# Patient Record
Sex: Female | Born: 1990 | Hispanic: Yes | Marital: Single | Smoking: Former smoker
Health system: Southern US, Community
[De-identification: ages and names within clinical notes are randomized; demographics above are authoritative.]

## PROBLEM LIST (undated history)

## (undated) DIAGNOSIS — D219 Benign neoplasm of connective and other soft tissue, unspecified: Secondary | ICD-10-CM

## (undated) HISTORY — PX: CHOLECYSTECTOMY: SHX55

## (undated) HISTORY — DX: Benign neoplasm of connective and other soft tissue, unspecified: D21.9

---
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
## 2009-01-27 ENCOUNTER — Emergency Department: Payer: Self-pay | Admitting: Emergency Medicine

## 2009-10-21 ENCOUNTER — Emergency Department: Payer: Self-pay | Admitting: Emergency Medicine

## 2009-12-22 ENCOUNTER — Emergency Department: Payer: Self-pay | Admitting: Emergency Medicine

## 2009-12-27 ENCOUNTER — Emergency Department: Payer: Self-pay | Admitting: Emergency Medicine

## 2010-05-01 ENCOUNTER — Observation Stay: Payer: Self-pay | Admitting: Obstetrics and Gynecology

## 2010-05-22 ENCOUNTER — Emergency Department: Payer: Self-pay | Admitting: Unknown Physician Specialty

## 2010-11-17 ENCOUNTER — Inpatient Hospital Stay: Payer: Self-pay | Admitting: Surgery

## 2010-11-21 LAB — PATHOLOGY REPORT

## 2014-03-26 ENCOUNTER — Emergency Department: Payer: Self-pay | Admitting: Emergency Medicine

## 2014-03-26 LAB — CBC
HCT: 35.4 % (ref 35.0–47.0)
HGB: 11.4 g/dL — ABNORMAL LOW (ref 12.0–16.0)
MCH: 29.3 pg (ref 26.0–34.0)
MCHC: 32.2 g/dL (ref 32.0–36.0)
MCV: 91 fL (ref 80–100)
Platelet: 202 10*3/uL (ref 150–440)
RBC: 3.89 10*6/uL (ref 3.80–5.20)
RDW: 13 % (ref 11.5–14.5)
WBC: 10.4 10*3/uL (ref 3.6–11.0)

## 2014-03-26 LAB — D-DIMER(ARMC): D-Dimer: 343 ng/ml

## 2015-04-11 IMAGING — CR DG CHEST 2V
1 series · 2 of 2 positions shown · non-contrast
Comparison: None.

CLINICAL DATA: Pleurisy.  Three days trouble breathing.

EXAM:
CHEST  2 VIEW

[Series 1: w chest pa · 0.14mm/px · 2 of 2 slices shown]
[im 1/2]
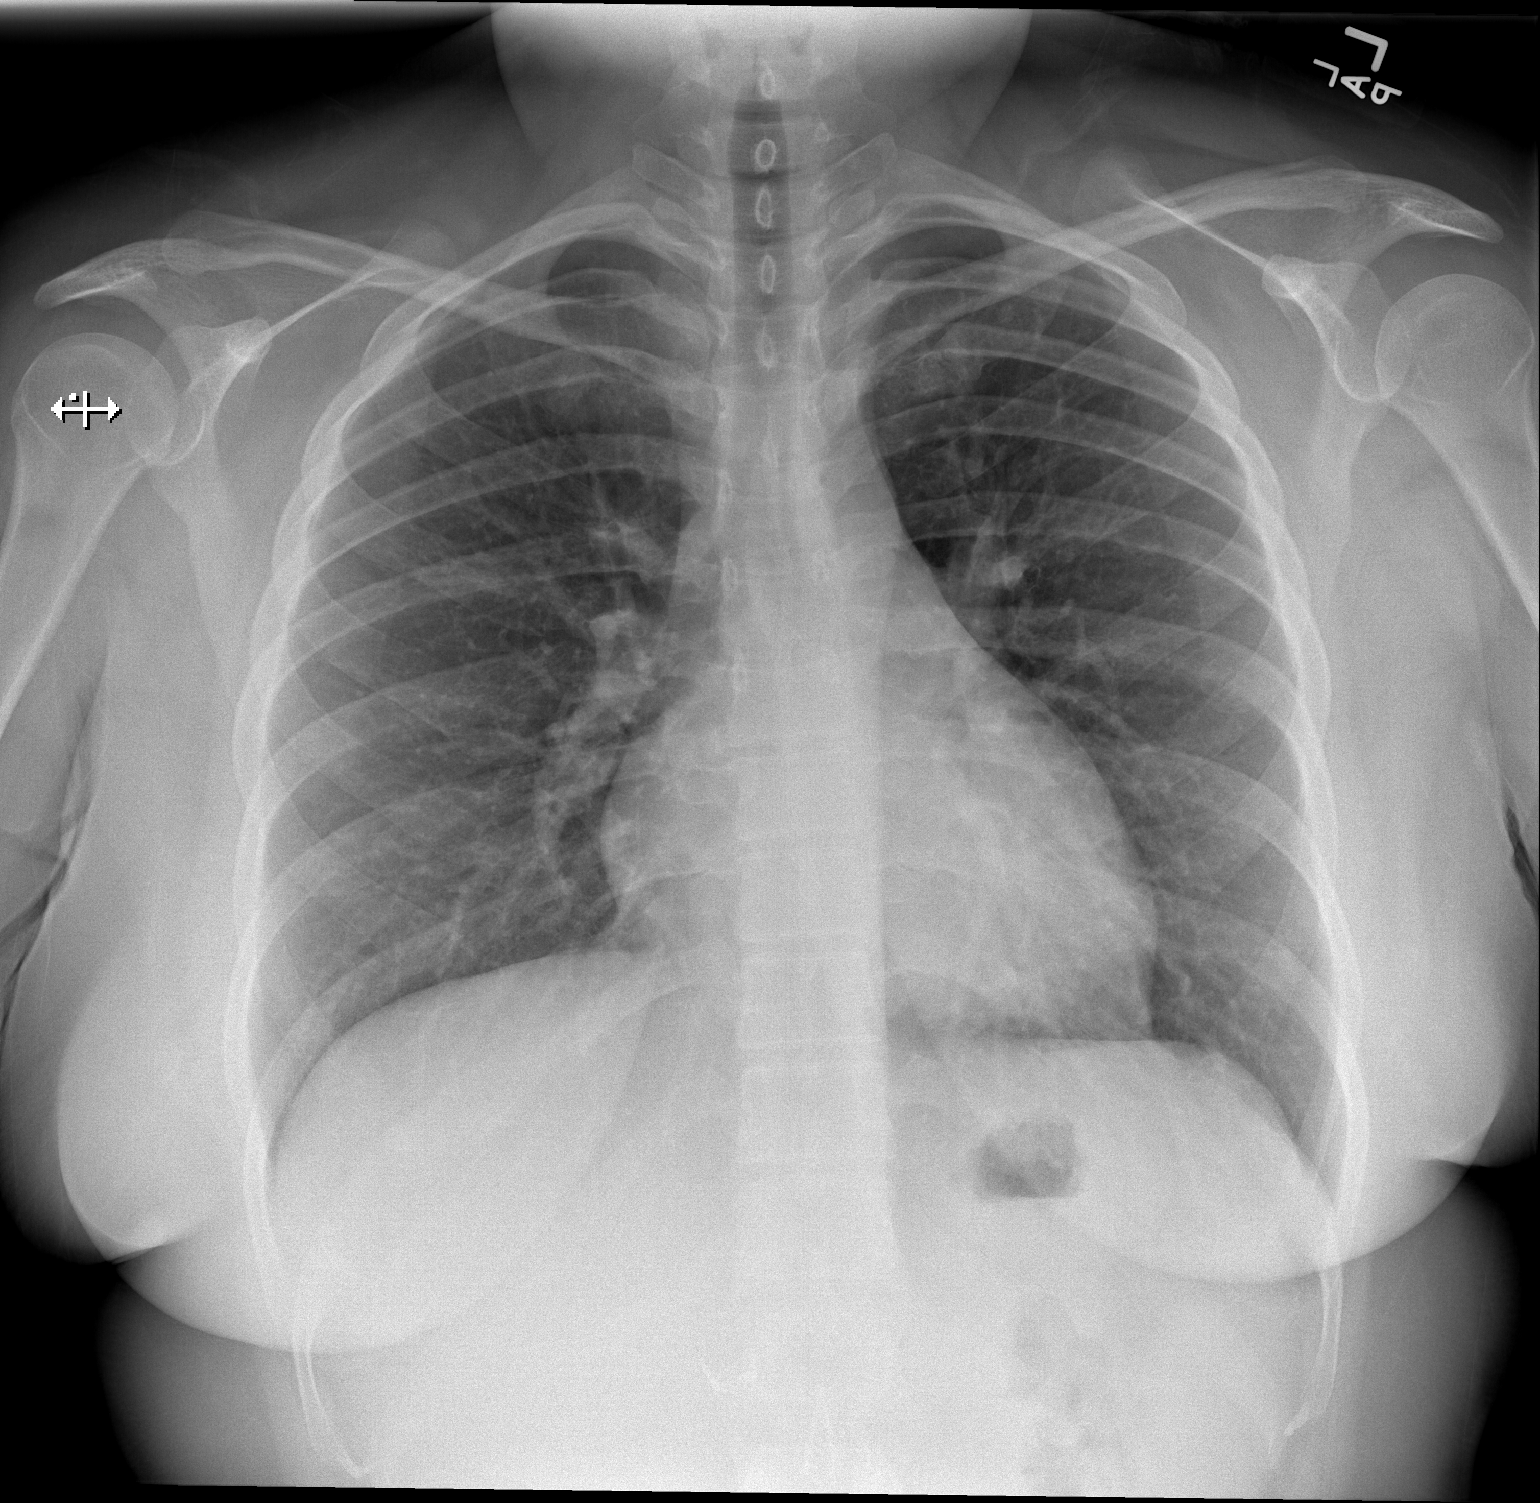
[im 2/2]
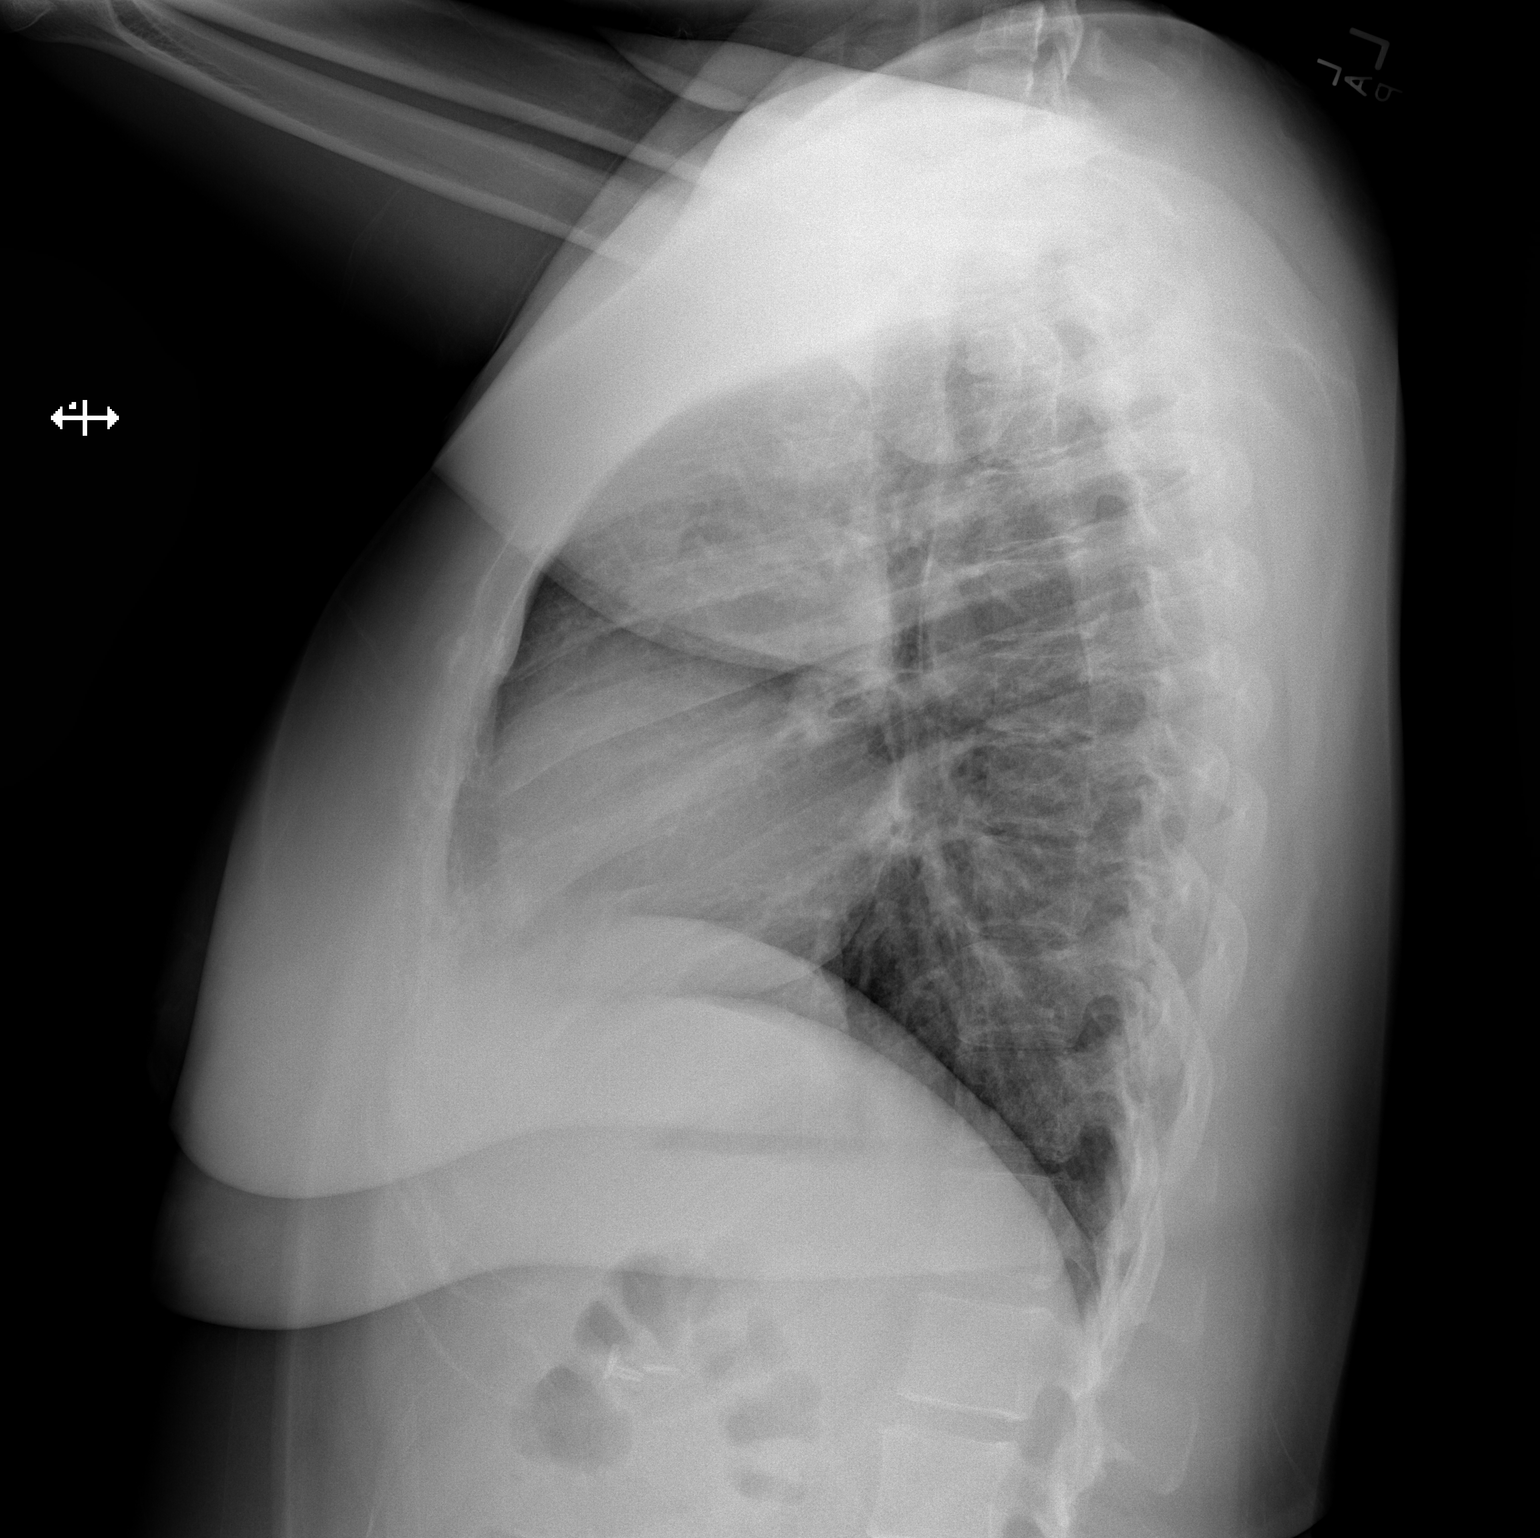

[2 of 2 positions shown; findings below may reference images not displayed]

FINDINGS: The heart size and mediastinal contours are within normal limits.
Both lungs are well expanded. No airspace disease, effusion, or
pneumothorax. The trachea is midline. The visualized skeletal
structures are unremarkable.
IMPRESSION: No active cardiopulmonary disease.

## 2018-03-21 ENCOUNTER — Encounter: Payer: Self-pay | Admitting: Podiatry

## 2018-03-21 ENCOUNTER — Ambulatory Visit (INDEPENDENT_AMBULATORY_CARE_PROVIDER_SITE_OTHER): Payer: 59

## 2018-03-21 ENCOUNTER — Ambulatory Visit: Payer: 59 | Admitting: Podiatry

## 2018-03-21 DIAGNOSIS — M722 Plantar fascial fibromatosis: Secondary | ICD-10-CM

## 2018-03-21 MED ORDER — METHYLPREDNISOLONE 4 MG PO TBPK: ORAL_TABLET | ORAL | 0 refills | Status: DC

## 2018-03-21 MED ORDER — MELOXICAM 15 MG PO TABS: 15.0000 mg | ORAL_TABLET | Freq: Every day | ORAL | 3 refills | Status: DC

## 2018-03-21 NOTE — Progress Notes (Signed)
  Subjective:  Patient ID: Erica Marshall, female    DOB: [birth year],  MRN: [id number] HPI Chief Complaint  Patient presents with  . Foot Pain    patient presents today for left heel pain x 1 month   She reports having sharp stabbing pains when first standing up from sitting and the pain is much worse first thing in the mornings.  She has taken Tylenol with some relief  . New Patient (Initial Visit)    27 y.o. female presents with the above complaint.   ROS: Denies fever chills nausea vomiting muscle aches and pains.  No past medical history on file.   Current Outpatient Medications:  .  JUNEL FE 1/20 1-20 MG-MCG tablet, TK 1 T PO QD, Disp: , Rfl: 0 .  meloxicam (MOBIC) 15 MG tablet, Take 1 tablet (15 mg total) by mouth daily., Disp: 30 tablet, Rfl: 3 .  methylPREDNISolone (MEDROL DOSEPAK) 4 MG TBPK tablet, 6 day dose pack - take as directed, Disp: 21 tablet, Rfl: 0  No Known Allergies Review of Systems Objective:  There were no vitals filed for this visit.  General: Well developed, nourished, in no acute distress, alert and oriented x3   Dermatological: Skin is warm, dry and supple bilateral. Nails x 10 are well maintained; remaining integument appears unremarkable at this time. There are no open sores, no preulcerative lesions, no rash or signs of infection present.  Vascular: Dorsalis Pedis artery and Posterior Tibial artery pedal pulses are 2/4 bilateral with immedate capillary fill time. Pedal hair growth present. No varicosities and no lower extremity edema present bilateral.   Neruologic: Grossly intact via light touch bilateral. Vibratory intact via tuning fork bilateral. Protective threshold with Semmes Wienstein monofilament intact to all pedal sites bilateral. Patellar and Achilles deep tendon reflexes 2+ bilateral. No Babinski or clonus noted bilateral.   Musculoskeletal: No gross boney pedal deformities bilateral. No pain, crepitus, or limitation noted  with foot and ankle range of motion bilateral. Muscular strength 5/5 in all groups tested bilateral.  Gait: Unassisted, Nonantalgic.    Radiographs:  Radiographs of the left foot taken today demonstrate an osseously mature individual small amount of plantar spurring of the left heel soft tissue increase in density plantar fashion calcaneal insertion site is intact.  No fractures are identified.  Assessment & Plan:   Assessment: Plantar fasciitis left.  Plan: Discussed etiology pathology conservative versus surgical therapies.  After sterile Betadine skin prep I injected 20 mg Kenalog 5 mg Marcaine point maximal tenderness of the left heel.  Tolerated procedure well without complications.  Start her on a Medrol Dosepak to be followed by meloxicam.  Discussed appropriate shoe gear stretching exercise ice therapy sugar modifications.     Erica Maselli T. [place], [place]

## 2018-03-21 NOTE — Patient Instructions (Signed)

## 2018-04-27 ENCOUNTER — Ambulatory Visit: Payer: 59 | Admitting: Podiatry

## 2018-04-27 ENCOUNTER — Encounter: Payer: Self-pay | Admitting: Podiatry

## 2018-04-27 DIAGNOSIS — M722 Plantar fascial fibromatosis: Secondary | ICD-10-CM

## 2018-04-27 MED ORDER — MELOXICAM 15 MG PO TABS: 15.0000 mg | ORAL_TABLET | Freq: Every day | ORAL | 3 refills | Status: DC

## 2018-04-27 NOTE — Progress Notes (Signed)
Presents today for follow-up of the left heel.  The heel is better but is still sore.  Objective: Vital signs are stable alert and oriented x3.  Pulses are palpable.  Neurologic sensorium is intact.  Degenerative flexors are intact.  Erica Marshall has pain on palpation medial calcaneal tubercle of the left heel.  Assessment: Plantar fasciitis resolving.  Plan: I injected the left heel today 20 mg Kenalog 5 mg Marcaine point maximal tenderness left.  Tolerated procedure well without complications.  Continue all other conservative therapies.

## 2018-06-08 ENCOUNTER — Ambulatory Visit: Payer: 59 | Admitting: Podiatry

## 2019-05-17 ENCOUNTER — Telehealth: Payer: Self-pay | Admitting: Physician Assistant

## 2019-05-17 NOTE — Telephone Encounter (Signed)
Received refill request from [facility] for patient.  Per [facility] chart patient had last RP 10/2017 and given Rx with refills for 1 year.  Form completed and OKed for patient to have refills x 3 of Junel Fe 1/20 28d 1 po QD at the same time daily.  Note on form that patient needs to RTC for an office visit prior to any more refills.  Faxed form and fax confirmation received from [facility].

## 2019-11-01 ENCOUNTER — Encounter: Payer: 59 | Admitting: Obstetrics & Gynecology

## 2019-11-29 ENCOUNTER — Encounter: Payer: 59 | Admitting: Obstetrics & Gynecology

## 2020-01-15 ENCOUNTER — Inpatient Hospital Stay: Admission: RE | Admit: 2020-01-15 | Payer: Self-pay | Source: Ambulatory Visit

## 2020-03-26 ENCOUNTER — Other Ambulatory Visit: Payer: 59 | Attending: Obstetrics and Gynecology

## 2020-05-03 ENCOUNTER — Inpatient Hospital Stay: Admission: RE | Admit: 2020-05-03 | Payer: 59 | Source: Ambulatory Visit

## 2020-05-29 ENCOUNTER — Other Ambulatory Visit: Payer: Self-pay | Admitting: Obstetrics and Gynecology

## 2020-05-29 DIAGNOSIS — O26843 Uterine size-date discrepancy, third trimester: Secondary | ICD-10-CM

## 2020-05-30 ENCOUNTER — Ambulatory Visit
Admission: RE | Admit: 2020-05-30 | Discharge: 2020-05-30 | Disposition: A | Discharge status: Home / Self Care | Payer: 59 | Source: Ambulatory Visit | Attending: Obstetrics and Gynecology | Admitting: Obstetrics and Gynecology

## 2020-05-30 ENCOUNTER — Other Ambulatory Visit: Payer: Self-pay

## 2020-05-30 DIAGNOSIS — O26843 Uterine size-date discrepancy, third trimester: Secondary | ICD-10-CM | POA: Diagnosis present

## 2020-07-10 LAB — HM PAP SMEAR

## 2021-06-15 IMAGING — US US OB COMP +14 WK
1 series · 15 of 28 positions shown · non-contrast
Comparison: none

CLINICAL DATA: Size greater than dates.

EXAM:
OBSTETRICAL ULTRASOUND >14 WKS

[Series 1: us ob comp + 14 wk · 15 of 50 slices shown]
[im 1/50]
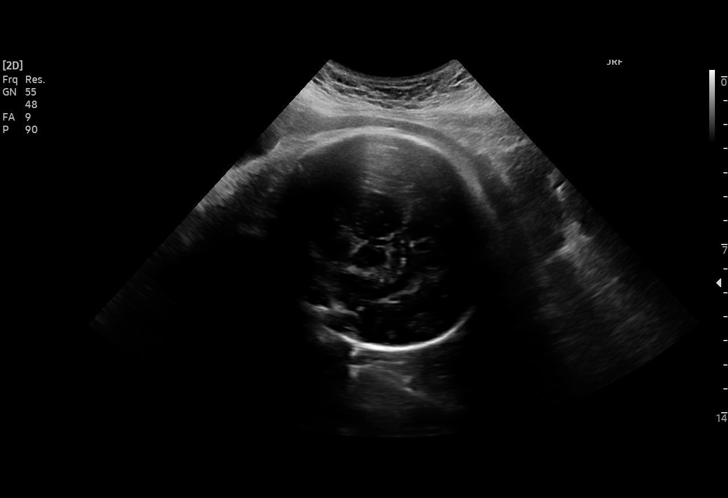
[im 4/50]
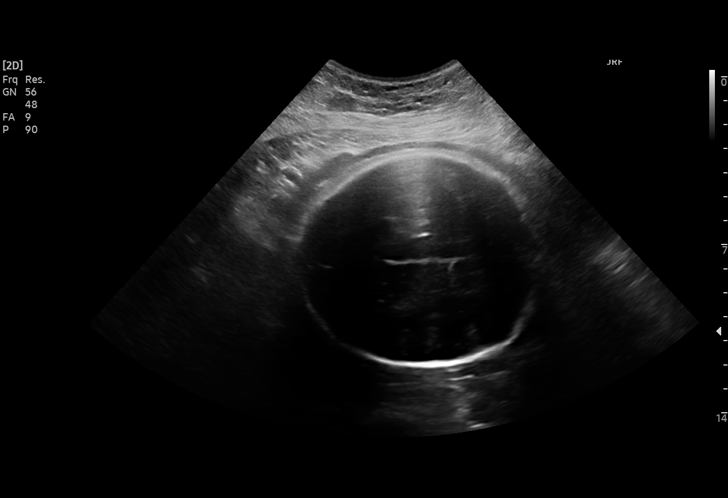
[im 8/50]
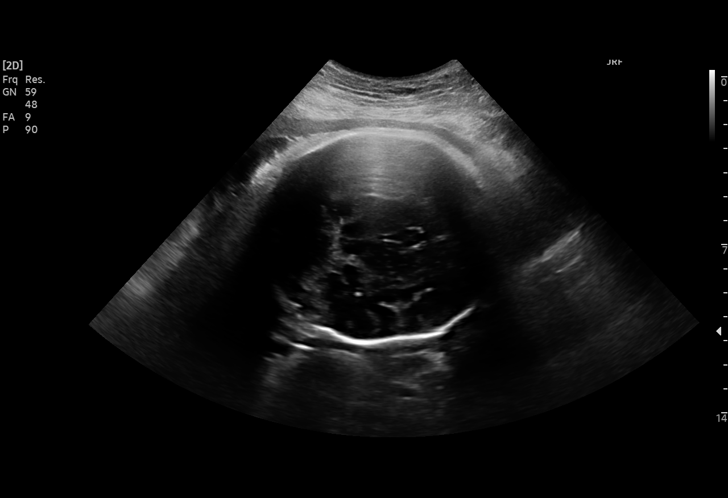
[im 11/50]
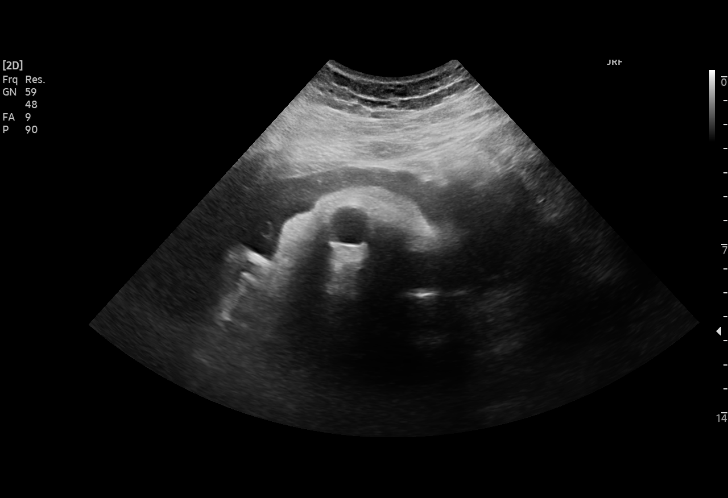
[im 15/50]
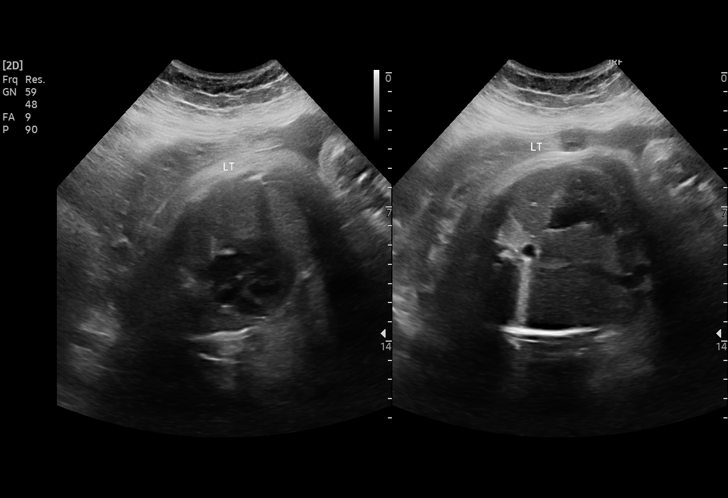
[im 19/50]
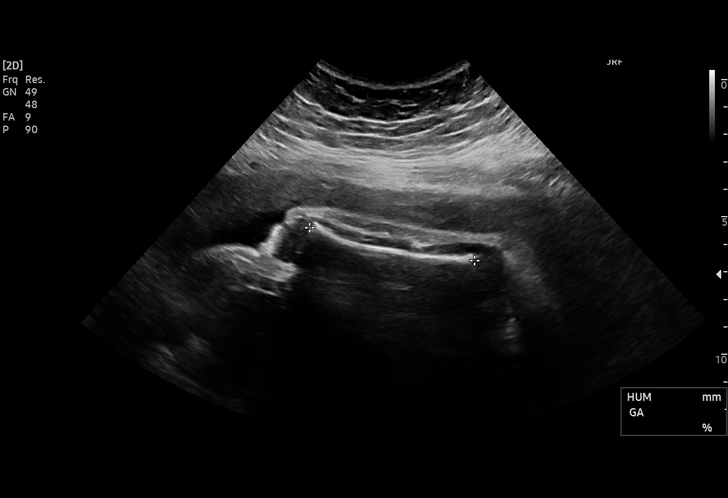
[im 22/50]
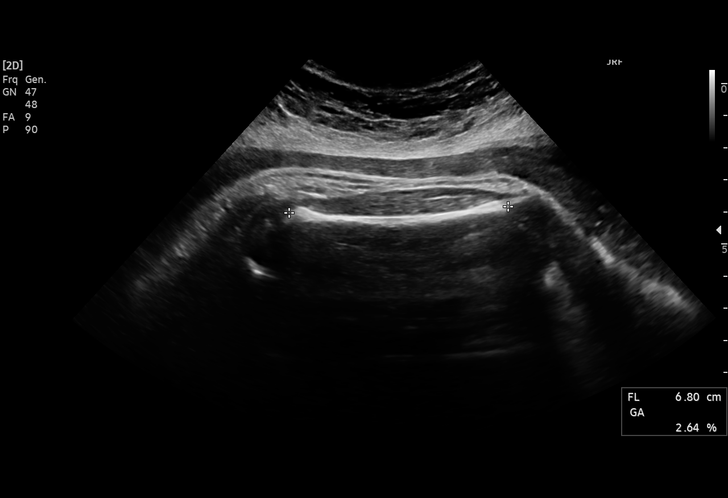
[im 26/50]
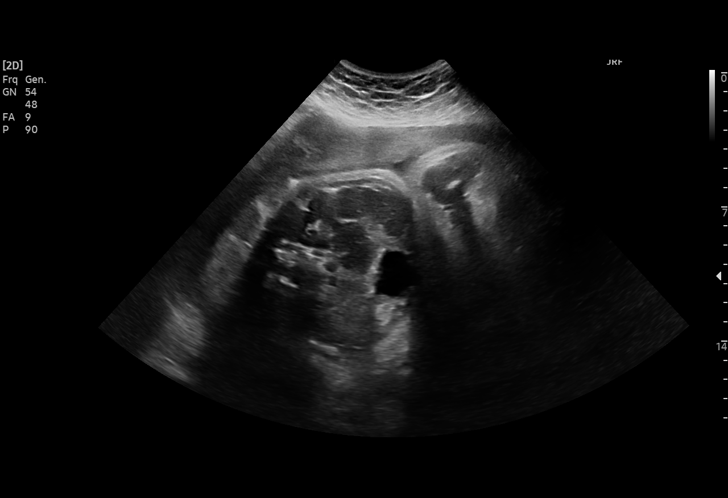
[im 28/50]
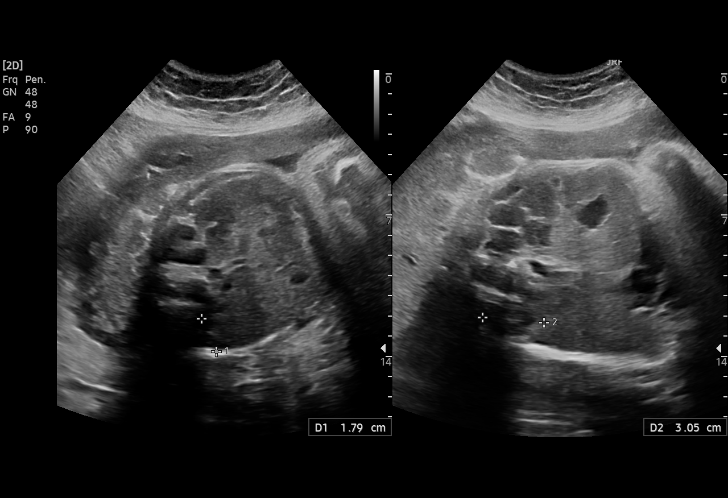
[im 31/50]
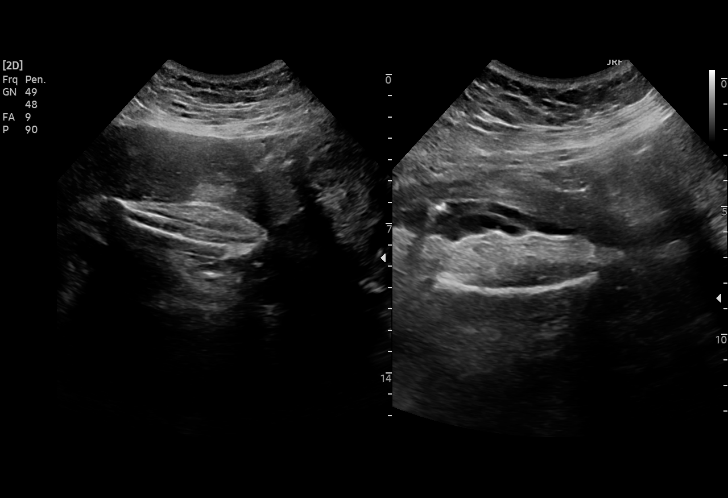
[im 35/50]
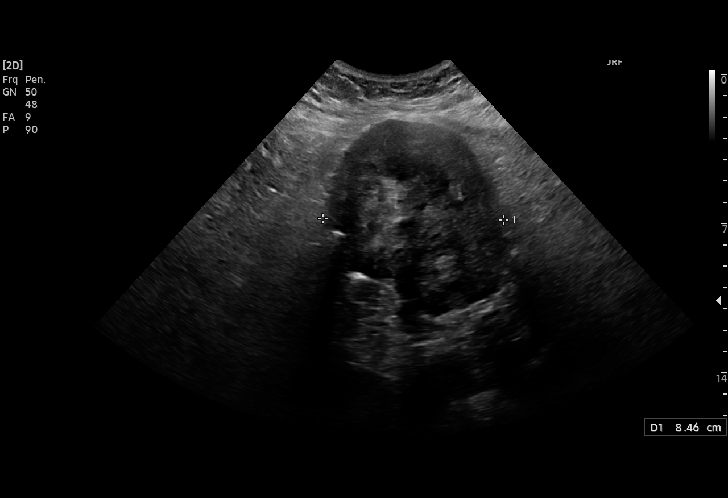
[im 39/50]
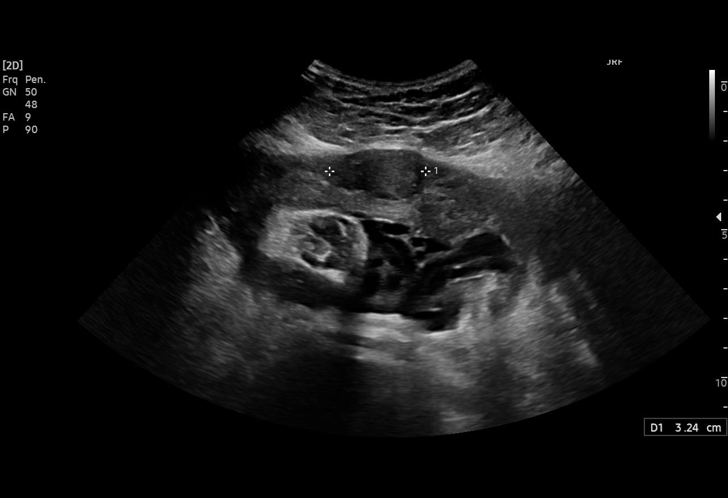
[im 42/50]
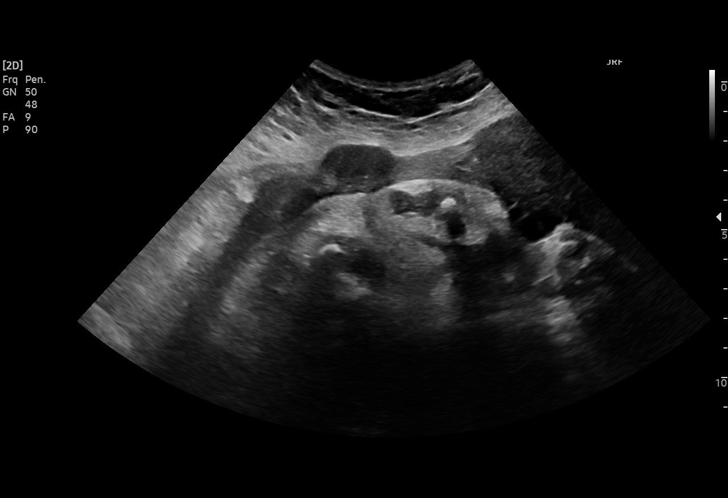
[im 46/50]
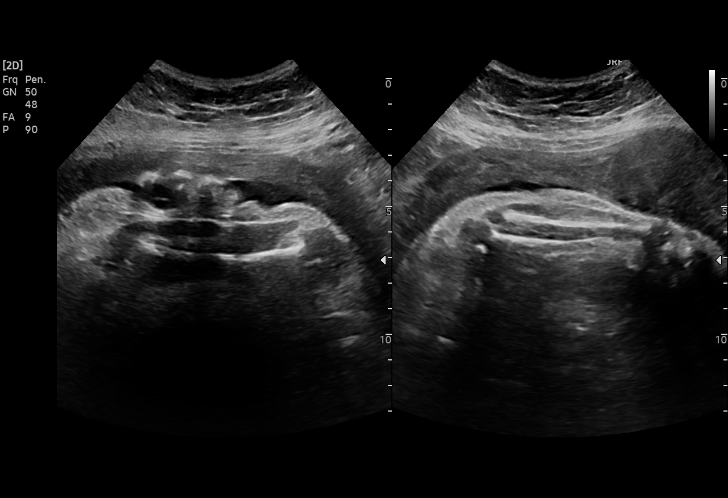
[im 50/50]
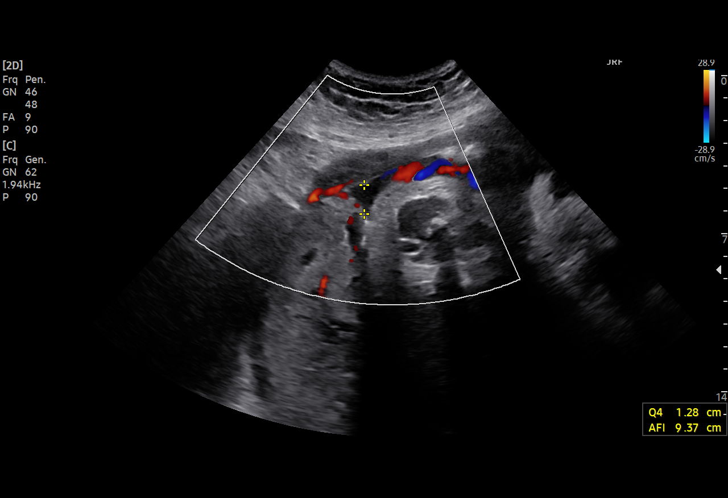

[15 of 28 positions shown; findings below may reference images not displayed]

FINDINGS: Number of Fetuses: 1

Heart Rate:  144 bpm

Movement: Yes

Presentation: Cephalic

Previa: No

Placental Location: Lateral Right

Amniotic Fluid (Subjective): Normal

Amniotic Fluid (Objective):

AFI = 9.4 cm (5%ile= 7.5 cm, 95%= 24.4 cm for 37 wks)

FETAL BIOMETRY

BPD: 8.7cm 35w 0d

HC:   30.9cm 34w 4d

AC:   34.7cm 38w 4d

FL:   6.8cm 35w 0d

Current Mean GA: 35w 5d US EDC: 06/29/2020

Reported assigned GA:  37w 6d Assigned EDC: 06/14/2020

Estimated Fetal Weight:  3,052g 53%ile

FETAL ANATOMY

Lateral Ventricles: Appears normal

Thalami/CSP: Appears normal

Posterior Fossa:  Not visualized

Nuchal Region: Not visualized

Upper Lip: Not visualized

Spine: Not visualized

4 Chamber Heart on Left: Appears normal

LVOT: Not visualized

RVOT: Not visualized

Stomach on Left: Appears normal

3 Vessel Cord: Not visualized

Cord Insertion site: Not visualized

Kidneys: Appears normal

Bladder: Appears normal

Extremities: Appears normal

Sex: Not Visualized

Technically difficult due to: Advanced gestational age, fetal
positioning, and maternal habitus

Maternal Findings: Multiple heterogeneously hypoechoic intramural
masses, largest measuring 10.4 x 8.9 x 8.5 cm.

Cervix:  Not visualized.
IMPRESSION: 1. Single live intrauterine gestation in cephalic presentation.
Reported assigned gestational age of 37 weeks 6 days.
2. Mean gestational age based on today's measurements of 35 weeks 5
days.
3. Estimated fetal weight is in the 53rd percentile.
4. Amniotic fluid index of 9.4 cm, within normal limits.
5. Limited fetal anatomic survey.

## 2021-11-21 ENCOUNTER — Ambulatory Visit (INDEPENDENT_AMBULATORY_CARE_PROVIDER_SITE_OTHER): Payer: 59 | Admitting: Internal Medicine

## 2021-11-21 ENCOUNTER — Encounter: Payer: Self-pay | Admitting: Internal Medicine

## 2021-11-21 ENCOUNTER — Other Ambulatory Visit: Payer: Self-pay

## 2021-11-21 VITALS — BP 118/74 | HR 90 | Temp 98.0°F | Resp 16 | Ht 61.0 in | Wt 261.7 lb

## 2021-11-21 DIAGNOSIS — E66813 Obesity, class 3: Secondary | ICD-10-CM | POA: Insufficient documentation

## 2021-11-21 DIAGNOSIS — R7401 Elevation of levels of liver transaminase levels: Secondary | ICD-10-CM | POA: Diagnosis not present

## 2021-11-21 DIAGNOSIS — G4700 Insomnia, unspecified: Secondary | ICD-10-CM

## 2021-11-21 DIAGNOSIS — Z6841 Body Mass Index (BMI) 40.0 and over, adult: Secondary | ICD-10-CM

## 2021-11-21 DIAGNOSIS — R635 Abnormal weight gain: Secondary | ICD-10-CM | POA: Diagnosis not present

## 2021-11-21 DIAGNOSIS — Z1159 Encounter for screening for other viral diseases: Secondary | ICD-10-CM | POA: Diagnosis not present

## 2021-11-21 DIAGNOSIS — Z13220 Encounter for screening for lipoid disorders: Secondary | ICD-10-CM

## 2021-11-21 DIAGNOSIS — D219 Benign neoplasm of connective and other soft tissue, unspecified: Secondary | ICD-10-CM

## 2021-11-21 NOTE — Assessment & Plan Note (Signed)
Discussed lifestyle management at length, calorie restriction and exercise. Will check TSH, A1c and lipids today.  ?

## 2021-11-21 NOTE — Assessment & Plan Note (Signed)
Sleep schedule changes frequently due to job, discussed sleep hygiene and keeping to a consistent sleep schedule. Recommend starting with Melatonin 3-5 mg at night to help regulate sleep schedule.  ? ?

## 2021-11-21 NOTE — Assessment & Plan Note (Signed)
Following with Gynecology.  ?

## 2021-11-21 NOTE — Patient Instructions (Addendum)
It was great seeing you today! ? ?Plan discussed at today's visit: ?-Blood work ordered today, results will be uploaded to [facility].  ?-Start taking Melatonin 3-5 mg 1 hour before bed ? ?Follow up in: 1 year or sooner as needed  ? ?Take care and let us know if you have any questions or concerns prior to your next visit. ? ?Dr. Rosana Berger ? ?

## 2021-11-21 NOTE — Progress Notes (Signed)
? ?New Patient Office Visit ? ?Subjective:  ?Patient ID: Erica Marshall, female    DOB: 05/24/1991  Age: 31 y.o. MRN: [id number] ? ?CC:  ?Chief Complaint  ?Patient presents with  ? Establish Care  ? Insomnia  ?  Up at night but sleepy during the day  ? Weight Gain  ? ? ?HPI ?Erica Marshall presents as a new patient. Chronic medical conditions include a history of fibroids. Currently not on any birth control, seeing Gynecology soon to discuss management options. She does not take any daily medications.  ? ?Weight Gain: ?-Gained about 50 pounds since second baby was born in October 2021 ?-Diet: wakes up at 2 pm, eats once a day. Eats something like chicken and rice sometimes with vegetables. Goes to work at 5 until midnight. No regular exercise.  ? ?Insomnia: ?-Hard time falling asleep, no issues staying asleep  ?-Currently getting 4-5 hours a day ?-Not currently taking anything for sleep ?-Has fluctuating work schedule, sometimes works at night or during the day ?-Typically doesn't fall asleep until 4-5 am and will sleep until 2 pm ? ?Health Maintenance: ?-Blood work due ?-Pap in 2021, follows with gynecology  ? ? ?Past Medical History:  ?Diagnosis Date  ? Fibroids   ? ?Cholecystectomy 6 years ago  ? ?Family History  ?Problem Relation Age of Onset  ? Hypertension Mother   ? Diabetes Mother   ? Diabetes Father   ? ? ?Social History  ? ?Socioeconomic History  ? Marital status: Single  ?  Spouse name: Not on file  ? Number of children: Not on file  ? Years of education: Not on file  ? Highest education level: Not on file  ?Occupational History  ? Not on file  ?Tobacco Use  ? Smoking status: Former  ? Smokeless tobacco: Never  ?Vaping Use  ? Vaping Use: Never used  ?Substance and Sexual Activity  ? Alcohol use: Yes  ?  Comment: social drinker  ? Drug use: Never  ? Sexual activity: Yes  ?Other Topics Concern  ? Not on file  ?Social History Narrative  ? Not on file  ? ?Social Determinants of  Health  ? ?Financial Resource Strain: Not on file  ?Food Insecurity: Not on file  ?Transportation Needs: Not on file  ?Physical Activity: Not on file  ?Stress: Not on file  ?Social Connections: Not on file  ?Intimate Partner Violence: Not on file  ? ? ?ROS ?Review of Systems  ?Constitutional:  Negative for chills and fever.  ?Respiratory:  Negative for cough.   ?Cardiovascular:  Negative for chest pain.  ?Gastrointestinal:  Negative for abdominal pain.  ?Genitourinary:  Positive for menstrual problem.  ?Neurological:  Negative for headaches.  ? ?Objective:  ? ?Today's Vitals: BP 118/74   Pulse 90   Temp 98 ?F (36.7 ?C)   Resp 16   Ht '5\' 1"'$  (1.549 m)   Wt 261 lb 11.2 oz (118.7 kg)   LMP 10/29/2021   SpO2 96%   BMI 49.45 kg/m?  ? Danley Danker Weights  ? 11/21/21 0933  ?Weight: 261 lb 11.2 oz (118.7 kg)  ? ? ? ?Physical Exam ?Constitutional:   ?   Appearance: Normal appearance. She is obese.  ?HENT:  ?   Head: Normocephalic and atraumatic.  ?Eyes:  ?   Conjunctiva/sclera: Conjunctivae normal.  ?Cardiovascular:  ?   Rate and Rhythm: Normal rate and regular rhythm.  ?Pulmonary:  ?   Effort: Pulmonary effort is normal.  ?  Breath sounds: Normal breath sounds.  ?Musculoskeletal:  ?   Right lower leg: No edema.  ?   Left lower leg: No edema.  ?Skin: ?   General: Skin is warm and dry.  ?Neurological:  ?   General: No focal deficit present.  ?   Mental Status: She is alert. Mental status is at baseline.  ?Psychiatric:     ?   Mood and Affect: Mood normal.     ?   Behavior: Behavior normal.  ? ? ?Assessment & Plan:  ? ?Problem List Items Addressed This Visit   ? ?  ? Other  ? Class 3 severe obesity with body mass index (BMI) of 45.0 to 49.9 in adult [facility])  ?  Discussed lifestyle management at length, calorie restriction and exercise. Will check TSH, A1c and lipids today.  ?  ?  ? Relevant Orders  ? CBC w/Diff/Platelet  ? COMPLETE METABOLIC PANEL WITH GFR  ? Lipid Profile  ? TSH  ? HgB A1c  ? Fibroids  ?  Following with  Gynecology.  ?  ?  ? Relevant Orders  ? CBC w/Diff/Platelet  ? Insomnia  ?  Sleep schedule changes frequently due to job, discussed sleep hygiene and keeping to a consistent sleep schedule. Recommend starting with Melatonin 3-5 mg at night to help regulate sleep schedule.  ? ?  ?  ? RESOLVED: Lipid screening  ? Relevant Orders  ? Lipid Profile  ? ?Other Visit Diagnoses   ? ? Encounter for hepatitis C screening test for low risk patient    -  Primary  ? Relevant Orders  ? Hepatitis C Antibody  ? Weight gain      ? Relevant Orders  ? CBC w/Diff/Platelet  ? COMPLETE METABOLIC PANEL WITH GFR  ? TSH  ? HgB A1c  ? ?  ? ? ?Outpatient Encounter Medications as of 11/21/2021  ?Medication Sig  ? [DISCONTINUED] JUNEL FE 1/20 1-20 MG-MCG tablet TK 1 T PO QD  ? [DISCONTINUED] meloxicam (MOBIC) 15 MG tablet Take 1 tablet (15 mg total) by mouth daily.  ? ?No facility-administered encounter medications on file as of 11/21/2021.  ? ? ?Follow-up: Return in about 1 year (around 11/22/2022).  ? ?Teodora Medici, DO ? ?

## 2021-11-24 LAB — CBC WITH DIFFERENTIAL/PLATELET
Absolute Monocytes: 452 cells/uL (ref 200–950)
Basophils Absolute: 23 cells/uL (ref 0–200)
Basophils Relative: 0.3 %
Eosinophils Absolute: 172 cells/uL (ref 15–500)
Eosinophils Relative: 2.2 %
HCT: 36.8 % (ref 35.0–45.0)
Hemoglobin: 11.9 g/dL (ref 11.7–15.5)
Lymphs Abs: 2153 cells/uL (ref 850–3900)
MCH: 28.4 pg (ref 27.0–33.0)
MCHC: 32.3 g/dL (ref 32.0–36.0)
MCV: 87.8 fL (ref 80.0–100.0)
MPV: 12.1 fL (ref 7.5–12.5)
Monocytes Relative: 5.8 %
Neutro Abs: 5000 cells/uL (ref 1500–7800)
Neutrophils Relative %: 64.1 %
Platelets: 244 10*3/uL (ref 140–400)
RBC: 4.19 10*6/uL (ref 3.80–5.10)
RDW: 13.2 % (ref 11.0–15.0)
Total Lymphocyte: 27.6 %
WBC: 7.8 10*3/uL (ref 3.8–10.8)

## 2021-11-24 LAB — COMPLETE METABOLIC PANEL WITH GFR
AG Ratio: 1.4 (calc) (ref 1.0–2.5)
ALT: 57 U/L — ABNORMAL HIGH (ref 6–29)
AST: 25 U/L (ref 10–30)
Albumin: 4 g/dL (ref 3.6–5.1)
Alkaline phosphatase (APISO): 67 U/L (ref 31–125)
BUN: 10 mg/dL (ref 7–25)
CO2: 26 mmol/L (ref 20–32)
Calcium: 9.3 mg/dL (ref 8.6–10.2)
Chloride: 108 mmol/L (ref 98–110)
Creat: 0.54 mg/dL (ref 0.50–0.97)
Globulin: 2.9 g/dL (calc) (ref 1.9–3.7)
Glucose, Bld: 109 mg/dL — ABNORMAL HIGH (ref 65–99)
Potassium: 5.4 mmol/L — ABNORMAL HIGH (ref 3.5–5.3)
Sodium: 141 mmol/L (ref 135–146)
Total Bilirubin: 0.3 mg/dL (ref 0.2–1.2)
Total Protein: 6.9 g/dL (ref 6.1–8.1)
eGFR: 125 mL/min/{1.73_m2} (ref >=60)

## 2021-11-24 LAB — LIPID PANEL
Cholesterol: 126 mg/dL (ref <200)
HDL: 37 mg/dL — ABNORMAL LOW (ref >=50)
LDL Cholesterol (Calc): 67 mg/dL (calc)
Non-HDL Cholesterol (Calc): 89 mg/dL (calc) (ref <130)
Total CHOL/HDL Ratio: 3.4 (calc) (ref <5.0)
Triglycerides: 137 mg/dL (ref <150)

## 2021-11-24 LAB — HEMOGLOBIN A1C
Hgb A1c MFr Bld: 5.8 % of total Hgb — ABNORMAL HIGH (ref <5.7)
Mean Plasma Glucose: 120 mg/dL
eAG (mmol/L): 6.6 mmol/L

## 2021-11-24 LAB — HEPATITIS C ANTIBODY
Hepatitis C Ab: NONREACTIVE
SIGNAL TO CUT-OFF: 0.02 (ref <1.00)

## 2021-11-24 LAB — TSH: TSH: 1.71 mIU/L

## 2021-11-25 NOTE — Addendum Note (Signed)
Addended by: Teodora Medici on: 11/25/2021 01:17 PM ? ? Modules accepted: Orders ? ?
